# Patient Record
Sex: Female | Born: 1975 | Marital: Married
Health system: Southern US, Community
[De-identification: ages and names within clinical notes are randomized; demographics above are authoritative.]

---

## 2010-08-20 ENCOUNTER — Ambulatory Visit: Payer: Self-pay | Admitting: Internal Medicine

## 2011-05-12 ENCOUNTER — Emergency Department: Payer: Self-pay | Admitting: *Deleted

## 2011-10-10 ENCOUNTER — Emergency Department: Payer: Self-pay | Admitting: Emergency Medicine

## 2011-10-11 LAB — DRUG SCREEN, URINE
Amphetamines, Ur Screen: NEGATIVE (ref ?–1000)
Barbiturates, Ur Screen: NEGATIVE (ref ?–200)
Benzodiazepine, Ur Scrn: NEGATIVE (ref ?–200)
Cannabinoid 50 Ng, Ur ~~LOC~~: NEGATIVE (ref ?–50)
Methadone, Ur Screen: NEGATIVE (ref ?–300)
Opiate, Ur Screen: NEGATIVE (ref ?–300)
Phencyclidine (PCP) Ur S: NEGATIVE (ref ?–25)

## 2011-10-11 LAB — URINALYSIS, COMPLETE
Bacteria: NONE SEEN
Bilirubin,UR: NEGATIVE
Blood: NEGATIVE
Glucose,UR: NEGATIVE mg/dL (ref 0–75)
Specific Gravity: 1.009 (ref 1.003–1.030)
Squamous Epithelial: 2
WBC UR: <1 /HPF (ref 0–5)

## 2011-10-11 LAB — COMPREHENSIVE METABOLIC PANEL
Albumin: 4.3 g/dL (ref 3.4–5.0)
Anion Gap: 9 (ref 7–16)
BUN: 16 mg/dL (ref 7–18)
Bilirubin,Total: 0.2 mg/dL (ref 0.2–1.0)
Calcium, Total: 9.3 mg/dL (ref 8.5–10.1)
Chloride: 101 mmol/L (ref 98–107)
Creatinine: 0.75 mg/dL (ref 0.60–1.30)
EGFR (African American): >60
EGFR (Non-African Amer.): >60
Glucose: 90 mg/dL (ref 65–99)
Osmolality: 280 (ref 275–301)
Potassium: 3.4 mmol/L — ABNORMAL LOW (ref 3.5–5.1)
Sodium: 140 mmol/L (ref 136–145)
Total Protein: 7.8 g/dL (ref 6.4–8.2)

## 2011-10-11 LAB — CBC
HCT: 41.8 % (ref 35.0–47.0)
MCV: 93 fL (ref 80–100)
Platelet: 282 10*3/uL (ref 150–440)
RBC: 4.5 10*6/uL (ref 3.80–5.20)
RDW: 12.9 % (ref 11.5–14.5)
WBC: 8 10*3/uL (ref 3.6–11.0)

## 2012-11-17 ENCOUNTER — Emergency Department: Payer: Self-pay | Admitting: Unknown Physician Specialty

## 2012-11-17 LAB — URINALYSIS, COMPLETE
Bacteria: NONE SEEN
Glucose,UR: NEGATIVE mg/dL (ref 0–75)
Ketone: NEGATIVE
Protein: NEGATIVE
Specific Gravity: 1.01 (ref 1.003–1.030)
Squamous Epithelial: 1

## 2012-11-17 LAB — CBC
HCT: 40 % (ref 35.0–47.0)
HGB: 13.6 g/dL (ref 12.0–16.0)
MCH: 31.5 pg (ref 26.0–34.0)
MCV: 92 fL (ref 80–100)
Platelet: 370 10*3/uL (ref 150–440)
RDW: 12.9 % (ref 11.5–14.5)
WBC: 8.5 10*3/uL (ref 3.6–11.0)

## 2012-11-17 LAB — COMPREHENSIVE METABOLIC PANEL
Alkaline Phosphatase: 41 U/L — ABNORMAL LOW (ref 50–136)
EGFR (African American): >60
EGFR (Non-African Amer.): >60
Osmolality: 277 (ref 275–301)
Potassium: 4 mmol/L (ref 3.5–5.1)

## 2012-11-17 LAB — MAGNESIUM: Magnesium: 1.8 mg/dL

## 2013-01-07 ENCOUNTER — Emergency Department: Payer: Self-pay | Admitting: Emergency Medicine

## 2013-04-24 ENCOUNTER — Ambulatory Visit: Payer: Self-pay

## 2013-04-24 LAB — COMPREHENSIVE METABOLIC PANEL
Alkaline Phosphatase: 40 U/L — ABNORMAL LOW (ref 50–136)
Anion Gap: 9 (ref 7–16)
BUN: 9 mg/dL (ref 7–18)
Calcium, Total: 9 mg/dL (ref 8.5–10.1)
Creatinine: 0.9 mg/dL (ref 0.60–1.30)
EGFR (African American): >60
Glucose: 92 mg/dL (ref 65–99)
Osmolality: 278 (ref 275–301)
Potassium: 4.1 mmol/L (ref 3.5–5.1)
SGPT (ALT): 20 U/L (ref 12–78)
Sodium: 140 mmol/L (ref 136–145)

## 2013-04-24 LAB — URINALYSIS, COMPLETE
Bilirubin,UR: NEGATIVE
Blood: NEGATIVE
Glucose,UR: NEGATIVE mg/dL (ref 0–75)
Ketone: NEGATIVE
Nitrite: NEGATIVE
Specific Gravity: 1.01 (ref 1.003–1.030)

## 2013-04-24 LAB — CBC WITH DIFFERENTIAL/PLATELET
Basophil #: 0.1 10*3/uL (ref 0.0–0.1)
Basophil %: 0.9 %
Eosinophil #: 0.1 10*3/uL (ref 0.0–0.7)
Eosinophil %: 1.7 %
HCT: 38 % (ref 35.0–47.0)
HGB: 13.1 g/dL (ref 12.0–16.0)
MCH: 31.5 pg (ref 26.0–34.0)
MCHC: 34.4 g/dL (ref 32.0–36.0)
Monocyte #: 0.5 x10 3/mm (ref 0.2–0.9)
Monocyte %: 6.5 %
Platelet: 279 10*3/uL (ref 150–440)
RDW: 13.1 % (ref 11.5–14.5)
WBC: 8.1 10*3/uL (ref 3.6–11.0)

## 2013-04-26 LAB — URINE CULTURE

## 2013-07-03 ENCOUNTER — Ambulatory Visit: Payer: Self-pay | Admitting: Family Medicine

## 2013-09-26 ENCOUNTER — Ambulatory Visit: Payer: Self-pay | Admitting: Family Medicine

## 2013-09-27 ENCOUNTER — Emergency Department: Payer: Self-pay | Admitting: Emergency Medicine

## 2013-09-27 ENCOUNTER — Ambulatory Visit: Payer: Self-pay | Admitting: Physician Assistant

## 2013-09-27 LAB — URINALYSIS, COMPLETE
Bilirubin,UR: NEGATIVE
Blood: NEGATIVE
Nitrite: NEGATIVE
Ph: 5 (ref 4.5–8.0)
Squamous Epithelial: 13

## 2013-09-27 LAB — BASIC METABOLIC PANEL
Anion Gap: 4 — ABNORMAL LOW (ref 7–16)
Calcium, Total: 8.4 mg/dL — ABNORMAL LOW (ref 8.5–10.1)
Chloride: 107 mmol/L (ref 98–107)
Co2: 28 mmol/L (ref 21–32)
EGFR (African American): >60
EGFR (Non-African Amer.): >60
Glucose: 74 mg/dL (ref 65–99)
Osmolality: 275 (ref 275–301)
Sodium: 139 mmol/L (ref 136–145)

## 2013-09-27 LAB — CBC
HCT: 37.1 % (ref 35.0–47.0)
HGB: 12.3 g/dL (ref 12.0–16.0)
MCH: 30.9 pg (ref 26.0–34.0)
MCHC: 33.2 g/dL (ref 32.0–36.0)
RBC: 3.99 10*6/uL (ref 3.80–5.20)
RDW: 13.1 % (ref 11.5–14.5)

## 2013-09-27 LAB — TROPONIN I: Troponin-I: <0.02 ng/mL

## 2013-11-30 ENCOUNTER — Inpatient Hospital Stay: Payer: Self-pay | Admitting: Psychiatry

## 2013-11-30 LAB — COMPREHENSIVE METABOLIC PANEL
ALBUMIN: 3.8 g/dL (ref 3.4–5.0)
ALK PHOS: 33 U/L — AB
ALT: 20 U/L (ref 12–78)
ANION GAP: 4 — AB (ref 7–16)
BUN: 14 mg/dL (ref 7–18)
Bilirubin,Total: 0.4 mg/dL (ref 0.2–1.0)
Calcium, Total: 8.5 mg/dL (ref 8.5–10.1)
Chloride: 103 mmol/L (ref 98–107)
Co2: 30 mmol/L (ref 21–32)
Creatinine: 0.81 mg/dL (ref 0.60–1.30)
EGFR (African American): >60
EGFR (Non-African Amer.): >60
GLUCOSE: 93 mg/dL (ref 65–99)
Osmolality: 274 (ref 275–301)
Potassium: 3.8 mmol/L (ref 3.5–5.1)
SGOT(AST): 27 U/L (ref 15–37)
Sodium: 137 mmol/L (ref 136–145)
Total Protein: 7.4 g/dL (ref 6.4–8.2)

## 2013-11-30 LAB — URINALYSIS, COMPLETE
Bilirubin,UR: NEGATIVE
Blood: NEGATIVE
Glucose,UR: NEGATIVE mg/dL (ref 0–75)
KETONE: NEGATIVE
LEUKOCYTE ESTERASE: NEGATIVE
Nitrite: NEGATIVE
PH: 7 (ref 4.5–8.0)
Protein: NEGATIVE
RBC,UR: 1 /HPF (ref 0–5)
Specific Gravity: 1.016 (ref 1.003–1.030)
WBC UR: 1 /HPF (ref 0–5)

## 2013-11-30 LAB — DRUG SCREEN, URINE
AMPHETAMINES, UR SCREEN: NEGATIVE (ref ?–1000)
Barbiturates, Ur Screen: NEGATIVE (ref ?–200)
Benzodiazepine, Ur Scrn: NEGATIVE (ref ?–200)
Cannabinoid 50 Ng, Ur ~~LOC~~: NEGATIVE (ref ?–50)
Cocaine Metabolite,Ur ~~LOC~~: NEGATIVE (ref ?–300)
MDMA (ECSTASY) UR SCREEN: NEGATIVE (ref ?–500)
Methadone, Ur Screen: NEGATIVE (ref ?–300)
Opiate, Ur Screen: NEGATIVE (ref ?–300)
Phencyclidine (PCP) Ur S: NEGATIVE (ref ?–25)
Tricyclic, Ur Screen: NEGATIVE (ref ?–1000)

## 2013-11-30 LAB — CBC
HCT: 40.2 % (ref 35.0–47.0)
HGB: 13.2 g/dL (ref 12.0–16.0)
MCH: 30.9 pg (ref 26.0–34.0)
MCHC: 32.9 g/dL (ref 32.0–36.0)
MCV: 94 fL (ref 80–100)
Platelet: 321 10*3/uL (ref 150–440)
RBC: 4.28 10*6/uL (ref 3.80–5.20)
RDW: 13.4 % (ref 11.5–14.5)
WBC: 6.9 10*3/uL (ref 3.6–11.0)

## 2013-11-30 LAB — ACETAMINOPHEN LEVEL: Acetaminophen: <2 ug/mL

## 2013-11-30 LAB — ETHANOL: Ethanol: <3 mg/dL

## 2013-11-30 LAB — PREGNANCY, URINE: PREGNANCY TEST, URINE: NEGATIVE m[IU]/mL

## 2013-11-30 LAB — SALICYLATE LEVEL: Salicylates, Serum: <1.7 mg/dL

## 2013-11-30 LAB — TSH: THYROID STIMULATING HORM: 2.67 u[IU]/mL

## 2013-12-04 LAB — VALPROIC ACID LEVEL: Valproic Acid: 93 ug/mL

## 2013-12-04 LAB — AMMONIA: Ammonia, Plasma: 40 mcmol/L — ABNORMAL HIGH (ref 11–32)

## 2013-12-05 LAB — AMMONIA: AMMONIA, PLASMA: 21 umol/L (ref 11–32)

## 2014-02-15 IMAGING — CR DG CHEST 1V
1 series · 1 of 1 positions shown · non-contrast
Comparison: none

REASON FOR EXAM: [DATE] + PPD 10mm, rule out ,no signs, symptoms
COMMENTS:

[pa]
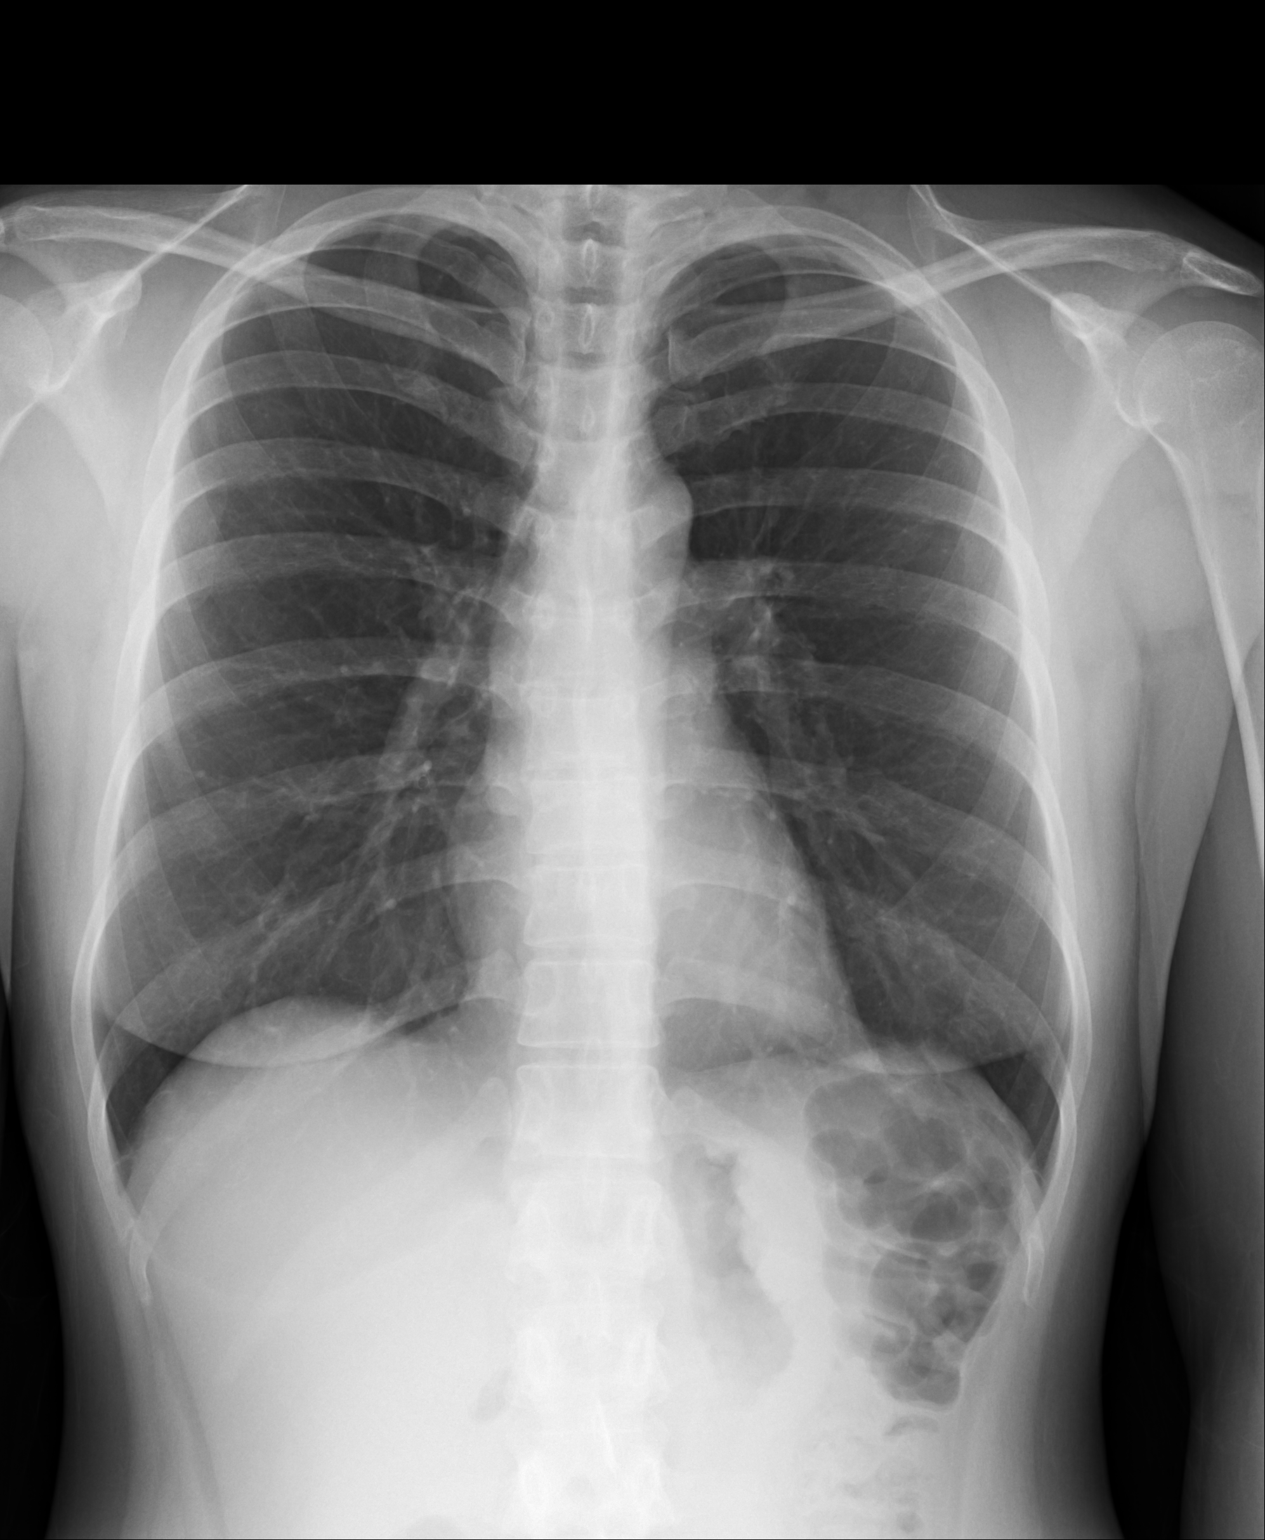

[1 of 1 positions shown; findings below may reference images not displayed]

PROCEDURE:     DXR - DXR CHEST 1 VIEWAP OR PA  - July 03, 2013 [DATE]

RESULT:     Comparison is made to the study March 17, 2013.

The lungs are clear. The heart and pulmonary vessels are normal. The bony
and mediastinal structures are unremarkable. There is no effusion. There is
no pneumothorax or evidence of congestive failure.
IMPRESSION: No acute cardiopulmonary disease.

[REDACTED]

## 2014-05-12 IMAGING — CT CT HEAD WITHOUT CONTRAST
1 of 2 series · 16 of 30 positions shown, 20 images · non-contrast
Comparison: None available for comparison at time of study
interpretation.

CLINICAL DATA: Headache and dizziness.

EXAM:
CT HEAD WITHOUT CONTRAST
TECHNIQUE: Contiguous axial images were obtained from the base of the skull
through the vertex without intravenous contrast.

[Series 2: head wo · axial · 0.40mm/px · z∈[-150,-42]mm · 16 of 28 slices shown, 20 images]
[im 2/28  brain]
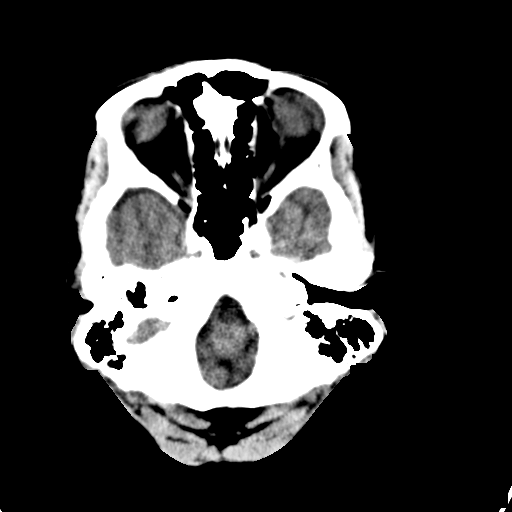
[im 2/28  bone]
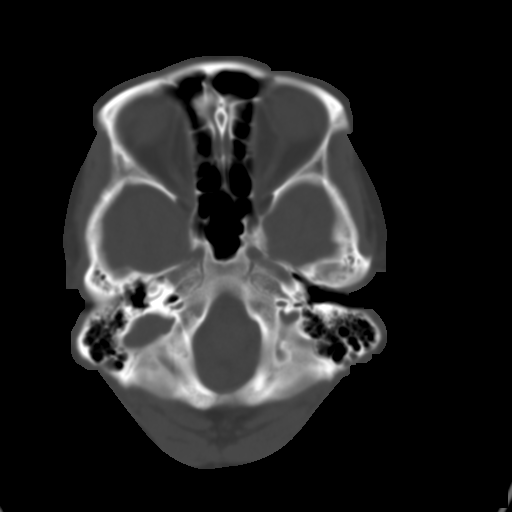
[im 4/28  brain]
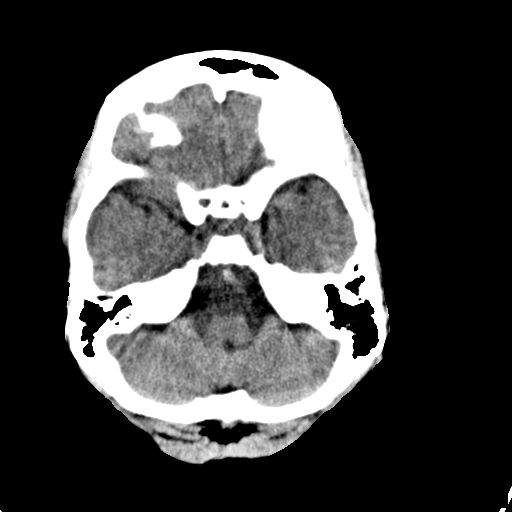
[im 5/28  brain]
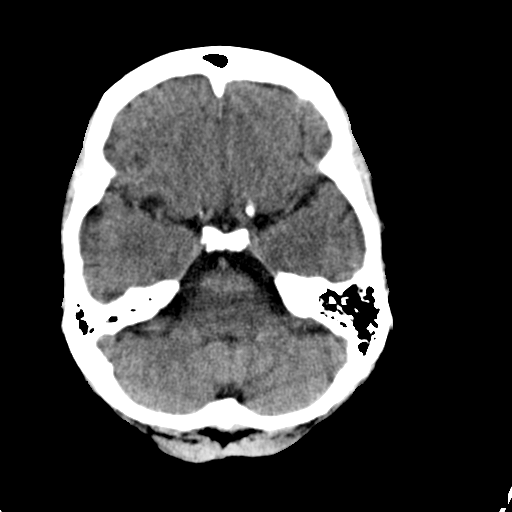
[im 7/28  brain]
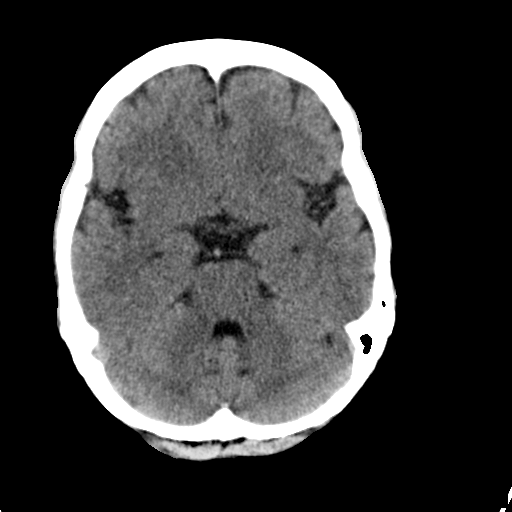
[im 8/28  brain]
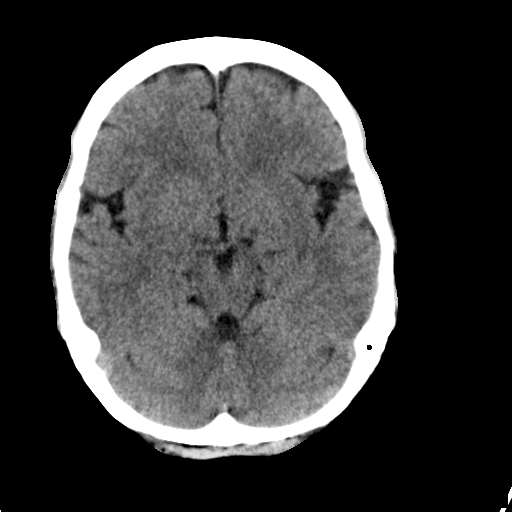
[im 8/28  bone]
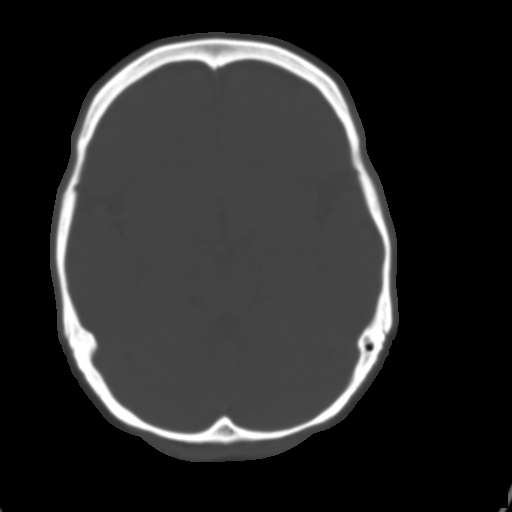
[im 10/28  brain]
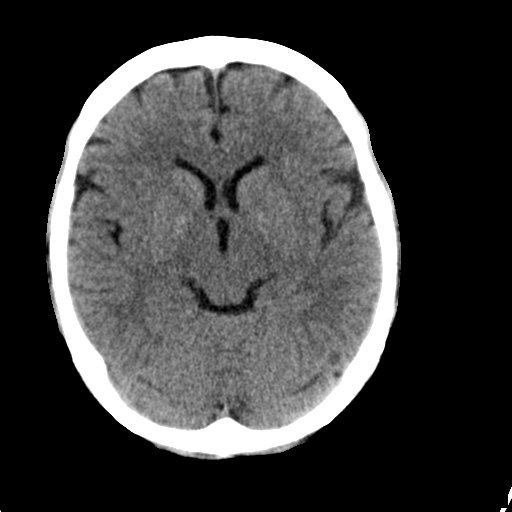
[im 12/28  brain]
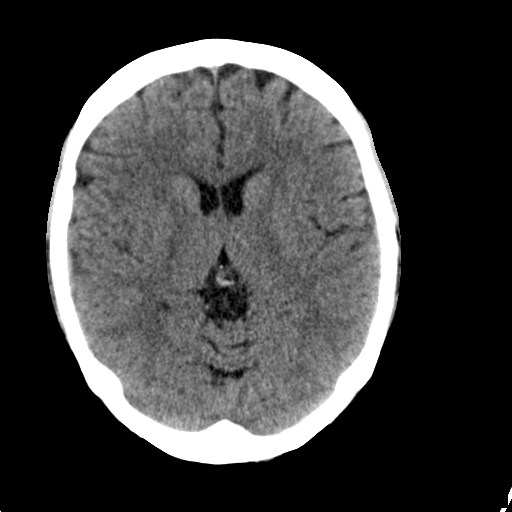
[im 13/28  brain]
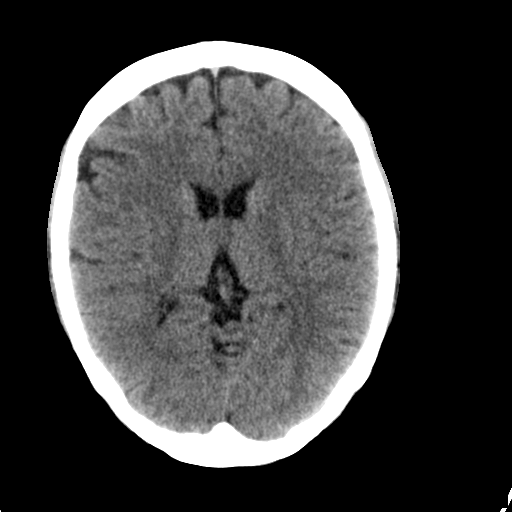
[im 15/28  brain]
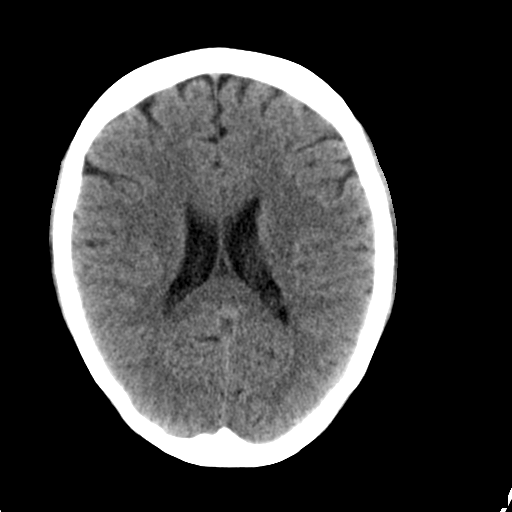
[im 15/28  bone]
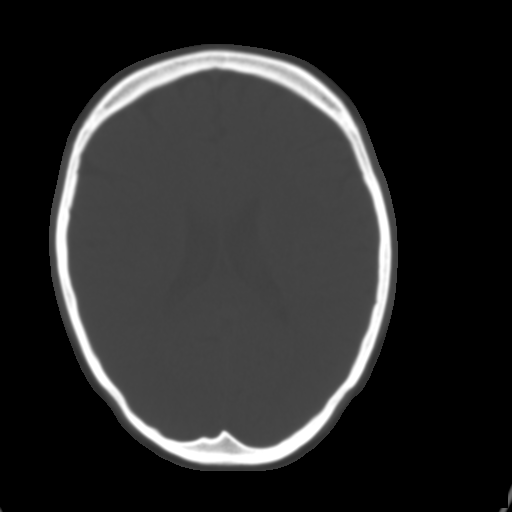
[im 16/28  brain]
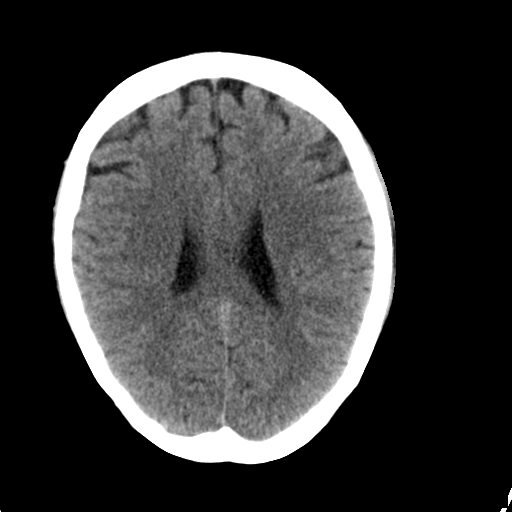
[im 19/28  brain]
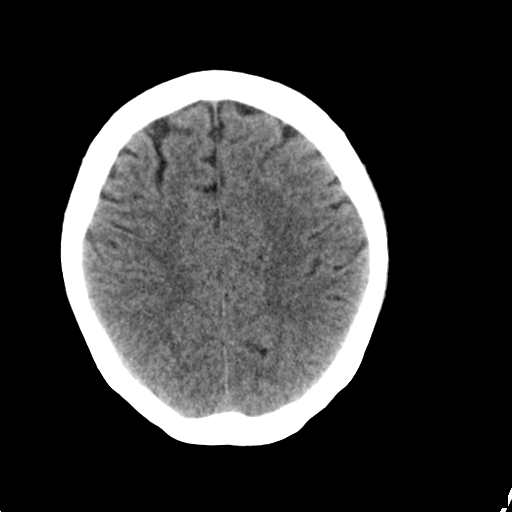
[im 20/28  brain]
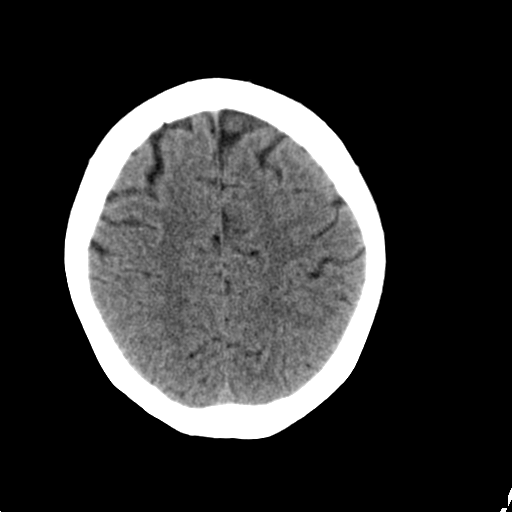
[im 21/28  brain]
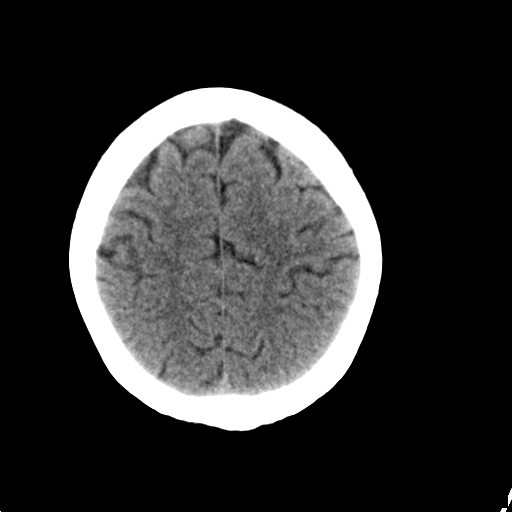
[im 21/28  bone]
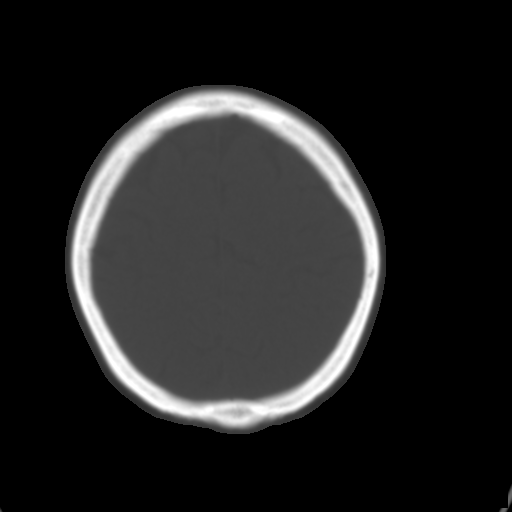
[im 23/28  brain]
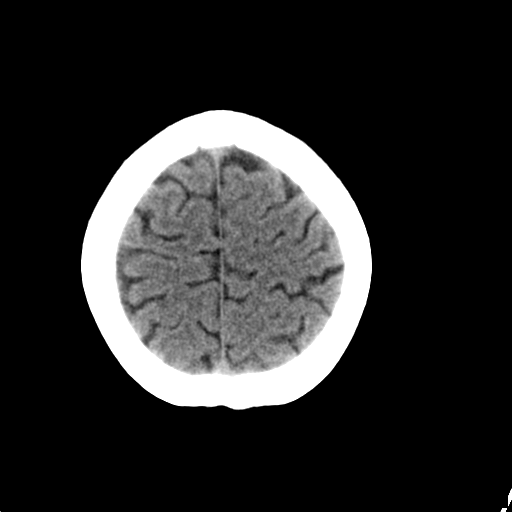
[im 24/28  brain]
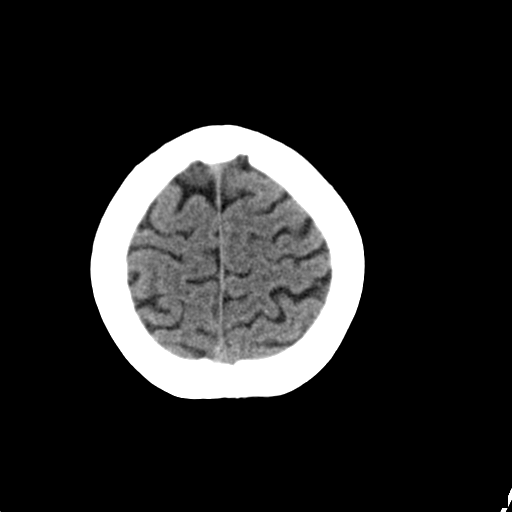
[im 26/28  brain]
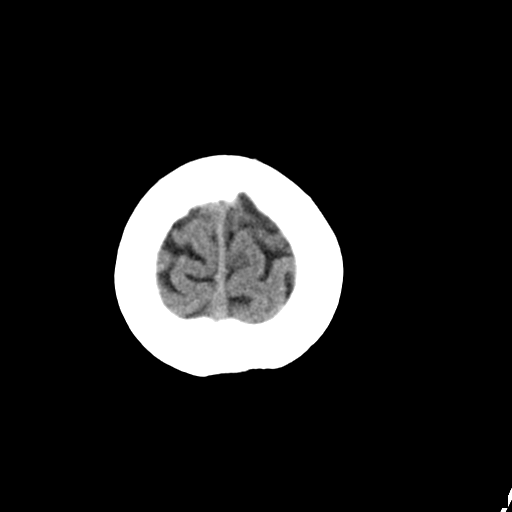

[16 of 30 positions shown; findings below may reference images not displayed]

FINDINGS: The ventricles and sulci are normal. No intraparenchymal hemorrhage,
mass effect nor midline shift. No acute large vascular territory
infarcts.

No abnormal extra-axial fluid collections. Basal cisterns are
patent.

No skull fracture. Visualized paranasal sinuses and mastoid
air-cells are well-aerated. The included ocular globes and orbital
contents are non-suspicious.
IMPRESSION: No acute intracranial process ; normal noncontrast CT of the head.

  By: Cintii Del Bueno

## 2015-01-30 NOTE — Consult Note (Signed)
PATIENT NAME:  Michaela CreaseCORNETT, Geralyn MR#:  [id number] DATE OF BIRTH:  [birth year]  DATE OF CONSULTATION:  11/30/2013  REFERRING PHYSICIAN:   CONSULTING PHYSICIAN:  Marieelena Bartko K. Guss Bundehalla, MD  DATE OF DICTATION: 11/30/2013   [facility] [place], [place]    AGE: 39 years   SEX: Female   RACE: Asian   SUBJECTIVE: The patient was seen in consultation in room #ER-BHU-4. The patient is a 39 year old Asian woman who is a poor historian. 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 According to information obtained, she had been talking to dead people. The patient reports that she has sixth sense which makes her talk to all sorts of people.   PAST PSYCHIATRIC HISTORY: Long history of mental illness and has been diagnosed with bipolar disorder.   ALCOHOL AND DRUGS: Denied.    MENTAL STATUS: The patient is alert and oriented. She is psychotic and admits that she talks to dead people and she has sixth sense so that she can do lots of things herself. Denies suicidal or homicidal ideas and said "I don't want to kill myself or others. I have sixth sense and have special powers." Insight and judgment guarded versus impaired.    IMPRESSION: Schizoaffective disorder, bipolar, with psychosis.    RECOMMENDATION: [facility] help and stabilization when bed is available.   ____________________________ Jannet MantisSurya K. Guss Bundehalla, MD skc:gb D: 11/30/2013 21:10:53 ET T: 12/01/2013 02:38:17 ET JOB#: [id number]  cc: Monika SalkSurya K. Guss Bundehalla, MD, <Dictator> Beau FannySURYA K Lavina Resor MD ELECTRONICALLY SIGNED 12/06/2013 23:01
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
